# Patient Record
Sex: Male | Born: 2000 | Race: Black or African American | Hispanic: No | Marital: Single | Smoking: Never smoker
Health system: Southern US, Community
[De-identification: ages and names within clinical notes are randomized; demographics above are authoritative.]

## PROBLEM LIST (undated history)

## (undated) DIAGNOSIS — J40 Bronchitis, not specified as acute or chronic: Secondary | ICD-10-CM

## (undated) DIAGNOSIS — J45909 Unspecified asthma, uncomplicated: Secondary | ICD-10-CM

---
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
## 2022-01-25 IMAGING — XA DG KNEE 1-2V*R*
1 series · 2 of 2 positions shown · non-contrast
Comparison: None.

CLINICAL DATA: Elective surgery.

EXAM:
RIGHT KNEE - 1-2 VIEW; DG C-ARM 1-60 MIN

[Series 1: unknown protocol · 0.30mm/px · 2 of 2 slices shown]
[im 1/2]
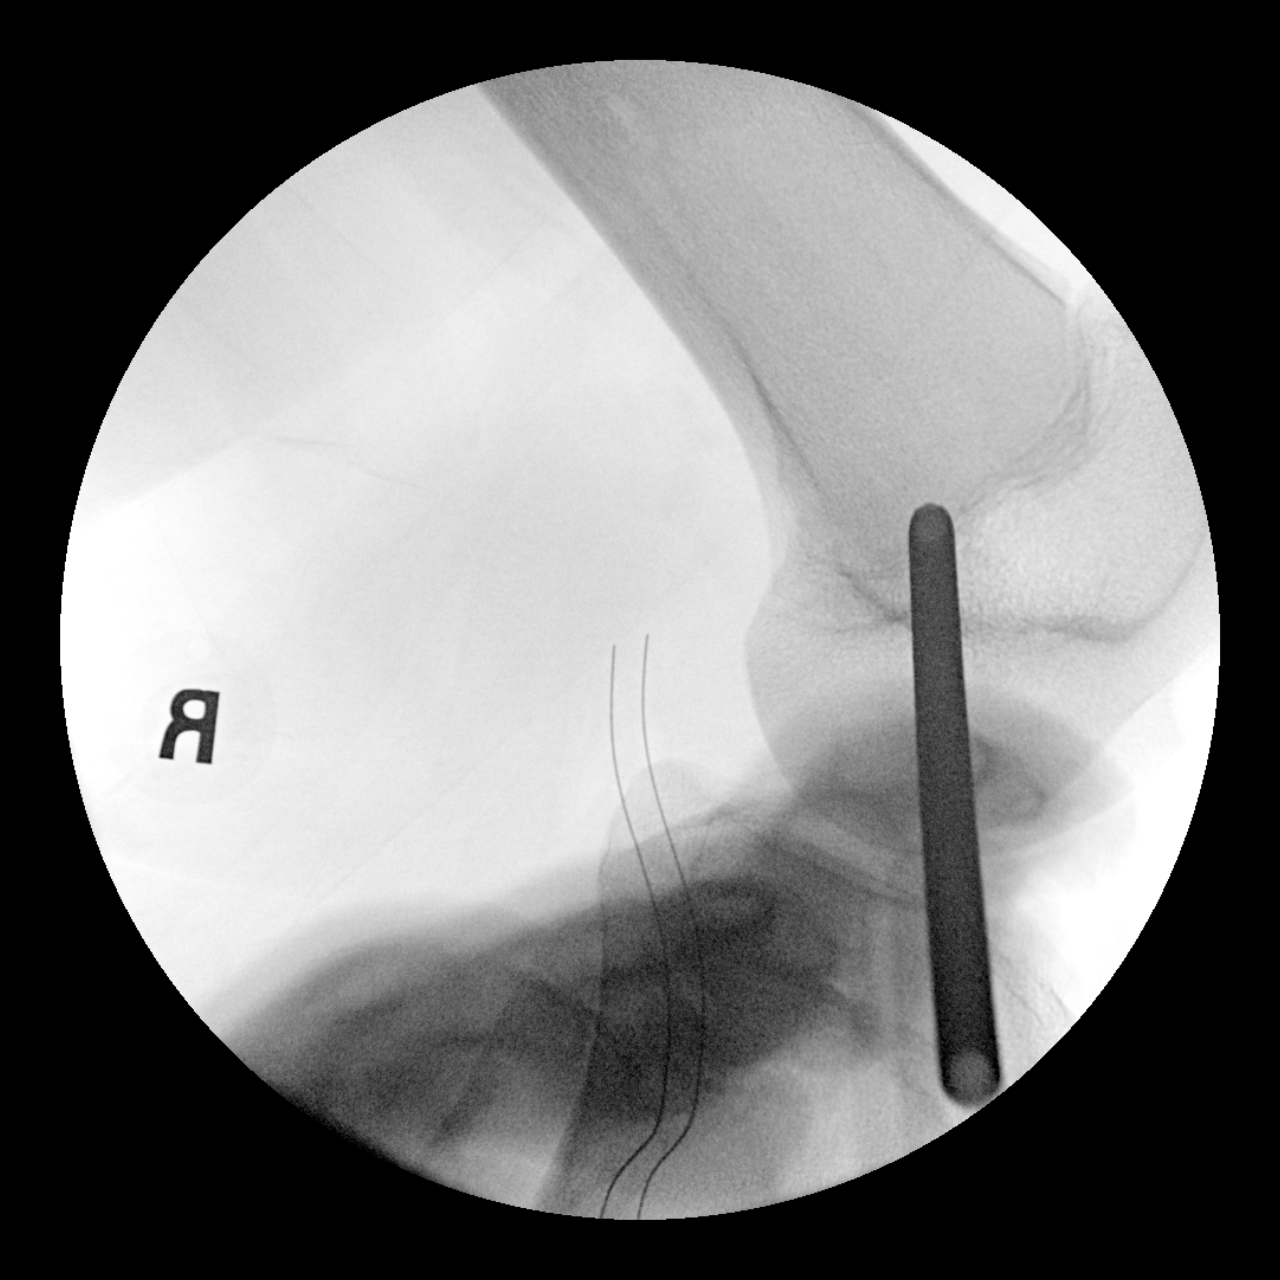
[im 2/2]
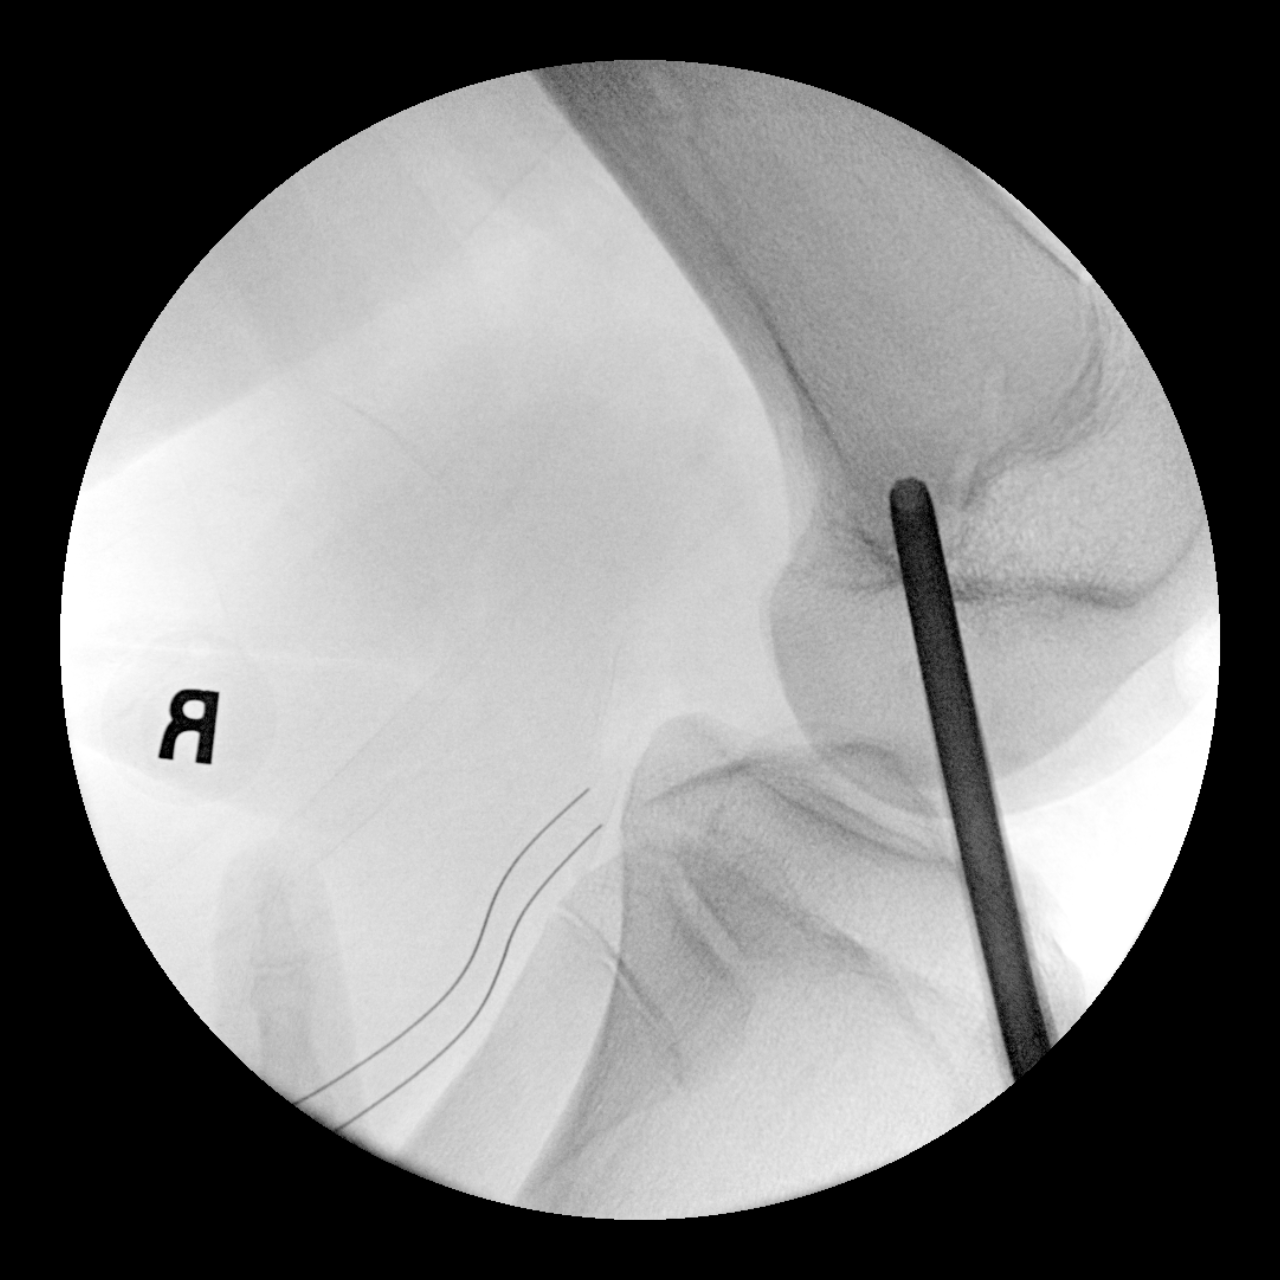

[2 of 2 positions shown; findings below may reference images not displayed]

FINDINGS: Two fluoroscopic spot views obtained in the operating room of the
right knee in lateral projection. Surgical instrument projects over
the distal femur. Total fluoroscopy time 6 seconds.
IMPRESSION: Intraoperative fluoroscopy during right knee surgery.
